# Patient Record
Sex: Male | Born: 1990 | Race: White | Hispanic: No | Marital: Married | Smoking: Current every day smoker
Health system: Southern US, Community
[De-identification: ages and names within clinical notes are randomized; demographics above are authoritative.]

---

## 2008-06-11 ENCOUNTER — Emergency Department: Payer: Self-pay | Admitting: Emergency Medicine

## 2008-10-22 ENCOUNTER — Emergency Department: Payer: Self-pay | Admitting: Emergency Medicine

## 2009-01-12 ENCOUNTER — Emergency Department: Payer: Self-pay | Admitting: Unknown Physician Specialty

## 2009-11-09 ENCOUNTER — Emergency Department: Payer: Self-pay | Admitting: Emergency Medicine

## 2011-12-20 ENCOUNTER — Emergency Department: Payer: Self-pay | Admitting: Emergency Medicine

## 2011-12-20 LAB — BASIC METABOLIC PANEL
Anion Gap: 9 (ref 7–16)
BUN: 10 mg/dL (ref 7–18)
Calcium, Total: 9.3 mg/dL (ref 8.5–10.1)
Chloride: 107 mmol/L (ref 98–107)
Co2: 28 mmol/L (ref 21–32)
EGFR (African American): >60
Osmolality: 286 (ref 275–301)
Potassium: 3.8 mmol/L (ref 3.5–5.1)

## 2011-12-20 LAB — CBC
HCT: 43.3 % (ref 40.0–52.0)
HGB: 15.2 g/dL (ref 13.0–18.0)
MCH: 33.9 pg (ref 26.0–34.0)
MCV: 97 fL (ref 80–100)
RBC: 4.48 10*6/uL (ref 4.40–5.90)
RDW: 12.7 % (ref 11.5–14.5)
WBC: 7.9 10*3/uL (ref 3.8–10.6)

## 2012-07-04 ENCOUNTER — Emergency Department: Payer: Self-pay

## 2012-07-04 LAB — URINALYSIS, COMPLETE
Bacteria: NONE SEEN
Bilirubin,UR: NEGATIVE
Glucose,UR: NEGATIVE mg/dL (ref 0–75)
Ketone: NEGATIVE
Nitrite: NEGATIVE
Ph: 5 (ref 4.5–8.0)
RBC,UR: 6 /HPF (ref 0–5)
Specific Gravity: 1.028 (ref 1.003–1.030)
WBC UR: 1 /HPF (ref 0–5)

## 2012-09-05 ENCOUNTER — Emergency Department: Payer: Self-pay | Admitting: Emergency Medicine

## 2012-09-05 LAB — URINALYSIS, COMPLETE
Glucose,UR: NEGATIVE mg/dL (ref 0–75)
Leukocyte Esterase: NEGATIVE
Nitrite: NEGATIVE
Protein: NEGATIVE
Specific Gravity: 1.016 (ref 1.003–1.030)

## 2012-09-05 LAB — CBC
HCT: 45.7 % (ref 40.0–52.0)
HGB: 16 g/dL (ref 13.0–18.0)
MCH: 33.4 pg (ref 26.0–34.0)
MCHC: 35 g/dL (ref 32.0–36.0)
Platelet: 205 10*3/uL (ref 150–440)
RDW: 13.5 % (ref 11.5–14.5)
WBC: 7.2 10*3/uL (ref 3.8–10.6)

## 2012-09-05 LAB — COMPREHENSIVE METABOLIC PANEL
Albumin: 4.5 g/dL (ref 3.4–5.0)
Alkaline Phosphatase: 103 U/L (ref 50–136)
Anion Gap: 2 — ABNORMAL LOW (ref 7–16)
Bilirubin,Total: 0.5 mg/dL (ref 0.2–1.0)
Co2: 32 mmol/L (ref 21–32)
EGFR (African American): >60
EGFR (Non-African Amer.): >60
Osmolality: 277 (ref 275–301)
Potassium: 5 mmol/L (ref 3.5–5.1)
SGOT(AST): 36 U/L (ref 15–37)
Sodium: 138 mmol/L (ref 136–145)
Total Protein: 7.6 g/dL (ref 6.4–8.2)

## 2012-10-12 ENCOUNTER — Emergency Department: Payer: Self-pay | Admitting: Emergency Medicine

## 2013-08-19 ENCOUNTER — Emergency Department: Payer: Self-pay | Admitting: Emergency Medicine

## 2016-03-29 ENCOUNTER — Encounter: Payer: Self-pay | Admitting: Emergency Medicine

## 2016-03-29 ENCOUNTER — Emergency Department
Admission: EM | Admit: 2016-03-29 | Discharge: 2016-03-29 | Disposition: A | Discharge status: Home / Self Care | Payer: Medicaid Other | Attending: Emergency Medicine | Admitting: Emergency Medicine

## 2016-03-29 DIAGNOSIS — B349 Viral infection, unspecified: Secondary | ICD-10-CM | POA: Insufficient documentation

## 2016-03-29 DIAGNOSIS — R197 Diarrhea, unspecified: Secondary | ICD-10-CM | POA: Diagnosis present

## 2016-03-29 DIAGNOSIS — F17210 Nicotine dependence, cigarettes, uncomplicated: Secondary | ICD-10-CM | POA: Insufficient documentation

## 2016-03-29 LAB — COMPREHENSIVE METABOLIC PANEL
ALBUMIN: 5 g/dL (ref 3.5–5.0)
ALT: 17 U/L (ref 17–63)
ANION GAP: 10 (ref 5–15)
AST: 22 U/L (ref 15–41)
Alkaline Phosphatase: 96 U/L (ref 38–126)
BUN: 17 mg/dL (ref 6–20)
CO2: 26 mmol/L (ref 22–32)
Calcium: 9.5 mg/dL (ref 8.9–10.3)
Chloride: 100 mmol/L — ABNORMAL LOW (ref 101–111)
Creatinine, Ser: 0.71 mg/dL (ref 0.61–1.24)
GFR calc Af Amer: >60 mL/min (ref >60)
GFR calc non Af Amer: >60 mL/min (ref >60)
GLUCOSE: 102 mg/dL — AB (ref 65–99)
POTASSIUM: 4.4 mmol/L (ref 3.5–5.1)
Sodium: 136 mmol/L (ref 135–145)
TOTAL PROTEIN: 8.2 g/dL — AB (ref 6.5–8.1)
Total Bilirubin: 0.6 mg/dL (ref 0.3–1.2)

## 2016-03-29 LAB — URINALYSIS, COMPLETE (UACMP) WITH MICROSCOPIC
BACTERIA UA: NONE SEEN
Bilirubin Urine: NEGATIVE
Glucose, UA: NEGATIVE mg/dL
HGB URINE DIPSTICK: NEGATIVE
KETONES UR: NEGATIVE mg/dL
Leukocytes, UA: NEGATIVE
NITRITE: NEGATIVE
PROTEIN: NEGATIVE mg/dL
Specific Gravity, Urine: 1.026 (ref 1.005–1.030)
Squamous Epithelial / LPF: NONE SEEN
pH: 5 (ref 5.0–8.0)

## 2016-03-29 LAB — CBC
HEMATOCRIT: 48.6 % (ref 40.0–52.0)
HEMOGLOBIN: 16 g/dL (ref 13.0–18.0)
MCH: 30.7 pg (ref 26.0–34.0)
MCHC: 33 g/dL (ref 32.0–36.0)
MCV: 92.9 fL (ref 80.0–100.0)
Platelets: 220 10*3/uL (ref 150–440)
RBC: 5.23 MIL/uL (ref 4.40–5.90)
RDW: 13.9 % (ref 11.5–14.5)
WBC: 11.3 10*3/uL — ABNORMAL HIGH (ref 3.8–10.6)

## 2016-03-29 LAB — LIPASE, BLOOD: LIPASE: 18 U/L (ref 11–51)

## 2016-03-29 MED ORDER — ONDANSETRON 4 MG PO TBDP: 4.0000 mg | ORAL_TABLET | Freq: Three times a day (TID) | ORAL | 0 refills | Status: AC | PRN

## 2016-03-29 NOTE — ED Provider Notes (Signed)
[facility] Emergency Department Provider Note  Time seen: 8:10 PM  I have reviewed the triage vital signs and the nursing notes.   HISTORY  Chief Complaint Nasal Congestion; Fever; and Abdominal Pain    HPI Marc Elliott is a 26 y.o. male with no past medical history who presents to the emergency department with 2 days of generalized weakness, fatigue, nausea and diarrhea. According to the patient for the past 48 hours he has been feeling very fatigued, he has been nauseated and vomited several times. Has had several episodes of diarrhea. States he has been feeling body aches but denies any fever or chills. States a slight cough and nasal congestion. Denies any sore throat.  History reviewed. No pertinent past medical history.  There are no active problems to display for this patient.   History reviewed. No pertinent surgical history.  Prior to Admission medications   Not on File    No Known Allergies  No family history on file.  Social History Social History  Substance Use Topics  . Smoking status: Current Every Day Smoker    Packs/day: 1.00    Types: Cigarettes  . Smokeless tobacco: Never Used  . Alcohol use No    Review of Systems Constitutional: Negative for fever.Positive for nasal congestion. Cardiovascular: Negative for chest pain. Respiratory: Negative for shortness of breath. Positive for cough. Gastrointestinal: States mild diffuse abdominal cramping. Positive for nausea and vomiting. Several episodes of diarrhea. Genitourinary: Negative for dysuria Neurological: Negative for headache 10-point ROS otherwise negative.  ____________________________________________   PHYSICAL EXAM:  VITAL SIGNS: ED Triage Vitals  Enc Vitals Group     BP 03/29/16 1740 (!) 107/54     Pulse Rate 03/29/16 1740 100     Resp 03/29/16 1740 18     Temp 03/29/16 1740 98.6 F (37 C)     Temp Source 03/29/16 1740 Oral     SpO2 03/29/16 1740 99 %      Weight 03/29/16 1732 163 lb (73.9 kg)     Height 03/29/16 1732 5\' 10"  (1.778 m)     Head Circumference --      Peak Flow --      Pain Score 03/29/16 1732 7     Pain Loc --      Pain Edu? --      Excl. in GC? --     Constitutional: Alert and oriented. Well appearing and in no distress. Eyes: Normal exam ENT   Head: Normocephalic and atraumatic   Mouth/Throat: Mucous membranes are moist. Cardiovascular: Normal rate, regular rhythm. No murmur Respiratory: Normal respiratory effort without tachypnea nor retractions. Breath sounds are clear  Gastrointestinal: Soft, slight diffuse abdominal tenderness, no rebound or guarding. No focal area of tenderness identified. Musculoskeletal: Nontender with normal range of motion in all extremities.  Neurologic:  Normal speech and language. No gross focal neurologic deficits  Skin:  Skin is warm, dry and intact.  Psychiatric: Mood and affect are normal.      INITIAL IMPRESSION / ASSESSMENT AND PLAN / ED COURSE  Pertinent labs & imaging results that were available during my care of the patient were reviewed by me and considered in my medical decision making (see chart for details).  Patient has been seen in the emergency department for generalized weakness, body aches, occasional cough, nausea with several episodes of vomiting and several episodes of diarrhea. No focal tenderness identified during exam, vitals are reassuring. Patient's symptoms are most suggestive of a viral  illness. Patient's labs are largely within normal limits besides a slight leukocytosis. I discussed supportive care at home, obtaining plain of rest, Tylenol and Motrin and we will prescribe Zofran as needed. Patient agreeable to plan.  ____________________________________________   FINAL CLINICAL IMPRESSION(S) / ED DIAGNOSES  Viral illness    Minna Antis, MD 03/29/16 2016

## 2016-03-29 NOTE — ED Notes (Signed)
Pt discharged to home.  Family member driving.  Discharge instructions reviewed.  Verbalized understanding.  No questions or concerns at this time.  Teach back verified.  Pt in NAD.  No items left in ED.   

## 2016-03-29 NOTE — ED Triage Notes (Signed)
Patient presents to the ED with nausea, vomiting, and diarrhea, fever, body aches, and abdominal pain.  Patient states symptoms began yesterday.  Patient reports a family member with some similar symptoms.

## 2016-04-10 ENCOUNTER — Emergency Department: Payer: Medicaid Other

## 2016-04-10 ENCOUNTER — Encounter: Payer: Self-pay | Admitting: Medical Oncology

## 2016-04-10 ENCOUNTER — Emergency Department
Admission: EM | Admit: 2016-04-10 | Discharge: 2016-04-10 | Disposition: A | Discharge status: Home / Self Care | Payer: Medicaid Other | Attending: Emergency Medicine | Admitting: Emergency Medicine

## 2016-04-10 DIAGNOSIS — Z79899 Other long term (current) drug therapy: Secondary | ICD-10-CM | POA: Insufficient documentation

## 2016-04-10 DIAGNOSIS — Y999 Unspecified external cause status: Secondary | ICD-10-CM | POA: Insufficient documentation

## 2016-04-10 DIAGNOSIS — F17210 Nicotine dependence, cigarettes, uncomplicated: Secondary | ICD-10-CM | POA: Diagnosis not present

## 2016-04-10 DIAGNOSIS — W208XXA Other cause of strike by thrown, projected or falling object, initial encounter: Secondary | ICD-10-CM | POA: Insufficient documentation

## 2016-04-10 DIAGNOSIS — Y929 Unspecified place or not applicable: Secondary | ICD-10-CM | POA: Insufficient documentation

## 2016-04-10 DIAGNOSIS — Y9389 Activity, other specified: Secondary | ICD-10-CM | POA: Insufficient documentation

## 2016-04-10 DIAGNOSIS — S6992XA Unspecified injury of left wrist, hand and finger(s), initial encounter: Secondary | ICD-10-CM | POA: Diagnosis present

## 2016-04-10 MED ORDER — TRAMADOL HCL 50 MG PO TABS: 50.0000 mg | ORAL_TABLET | Freq: Four times a day (QID) | ORAL | 0 refills | Status: AC | PRN

## 2016-04-10 MED ORDER — OXYCODONE-ACETAMINOPHEN 5-325 MG PO TABS
1.0000 | ORAL_TABLET | Freq: Once | ORAL | Status: AC
  Administered 2016-04-10: 1 via ORAL
  Filled 2016-04-10: qty 1

## 2016-04-10 NOTE — ED Triage Notes (Signed)
Pt reports that he fell a couple days ago and injured his left hand, c/o pain to fingers.

## 2016-04-10 NOTE — ED Notes (Signed)
Pt c/o L thumb pain at this time. Pt states jammed L thumb and then reinjured when he fell onto his L hand. Pt presents with swelling noted at this time. Pt also c/o decreased movement to thumb, index, and middle finger. Sensation intact, pulses intact, cap refill < 3 seconds.

## 2016-04-10 NOTE — ED Provider Notes (Signed)
[facility] Emergency Department Provider Note  ____________________________________________  Time seen: Approximately 2:50 PM  I have reviewed the triage vital signs and the nursing notes.   HISTORY  Chief Complaint Hand Pain    HPI Marc Elliott is a 26 y.o. male emergency department with left hand pain after jamming his thumb while helping a friend move some logs. Then he proceeded to fall on left hand. He states that it is painful to move thumb. Patient denies any additional injuries. No head trauma or loss of consciousness. Patient denies fever, shortness breath, cough, chest pain, nausea, vomiting, abdominal pain, diarrhea, constipation.   History reviewed. No pertinent past medical history.  There are no active problems to display for this patient.   History reviewed. No pertinent surgical history.  Prior to Admission medications   Medication Sig Start Date End Date Taking? Authorizing Provider  ondansetron (ZOFRAN ODT) 4 MG disintegrating tablet Take 1 tablet (4 mg total) by mouth every 8 (eight) hours as needed for nausea or vomiting. 03/29/16   Minna AntisKevin Paduchowski, MD  traMADol (ULTRAM) 50 MG tablet Take 1 tablet (50 mg total) by mouth every 6 (six) hours as needed. 04/10/16 04/10/17  Enid DerryAshley Semira Stoltzfus, PA-C    Allergies Patient has no known allergies.  No family history on file.  Social History Social History  Substance Use Topics  . Smoking status: Current Every Day Smoker    Packs/day: 1.00    Types: Cigarettes  . Smokeless tobacco: Never Used  . Alcohol use No     Review of Systems  Constitutional: No fever/chills ENT: No upper respiratory complaints. Cardiovascular: No chest pain. Respiratory: No cough. No SOB. Gastrointestinal: No abdominal pain.  No nausea, no vomiting.  Skin: Negative for rash, abrasions, lacerations. Neurological: Negative for headaches, numbness or  tingling   ____________________________________________   PHYSICAL EXAM:  VITAL SIGNS: ED Triage Vitals  Enc Vitals Group     BP 04/10/16 1259 130/78     Pulse Rate 04/10/16 1259 72     Resp 04/10/16 1259 18     Temp 04/10/16 1259 97.5 F (36.4 C)     Temp Source 04/10/16 1259 Oral     SpO2 04/10/16 1259 98 %     Weight 04/10/16 1300 163 lb (73.9 kg)     Height 04/10/16 1300 5\' 10"  (1.778 m)     Head Circumference --      Peak Flow --      Pain Score 04/10/16 1300 10     Pain Loc --      Pain Edu? --      Excl. in GC? --      Constitutional: Alert and oriented. Well appearing and in no acute distress. Eyes: Conjunctivae are normal. PERRL. EOMI. Head: Atraumatic. ENT:      Ears:      Nose: No congestion/rhinnorhea.      Mouth/Throat: Mucous membranes are moist.  Neck: No stridor.  Cardiovascular: Normal rate, regular rhythm.  Good peripheral circulation. 2+ radial pulses. Respiratory: Normal respiratory effort without tachypnea or retractions. Lungs CTAB. Good air entry to the bases with no decreased or absent breath sounds. Musculoskeletal: No gross deformities appreciated. Limited range of motion of left thumb. Mild swelling present at base of thumb. Neurologic:  Normal speech and language. No gross focal neurologic deficits are appreciated.  Skin:  Skin is warm, dry and intact. No rash noted. Psychiatric: Mood and affect are normal. Speech and behavior are normal. Patient exhibits  appropriate insight and judgement.   ____________________________________________   LABS (all labs ordered are listed, but only abnormal results are displayed)  Labs Reviewed - No data to display ____________________________________________  EKG   ____________________________________________  RADIOLOGY  Dg Hand 2 View Left  Result Date: 04/10/2016 CLINICAL DATA:  26 year old male with history of pain in the left thigh lump after falling onto his left hand. Swelling in the left  thumb. EXAM: LEFT HAND - 2 VIEW COMPARISON:  No priors. FINDINGS: There is no evidence of fracture or dislocation. There is no evidence of arthropathy or other focal bone abnormality. Soft tissues are unremarkable. IMPRESSION: Negative. Electronically Signed   By: Trudie Reed M.D.   On: 04/10/2016 15:28    ____________________________________________    PROCEDURES  Procedure(s) performed:    Procedures    Medications  oxyCODONE-acetaminophen (PERCOCET/ROXICET) 5-325 MG per tablet 1 tablet (1 tablet Oral Given 04/10/16 1504)     ____________________________________________   INITIAL IMPRESSION / ASSESSMENT AND PLAN / ED COURSE  Pertinent labs & imaging results that were available during my care of the patient were reviewed by me and considered in my medical decision making (see chart for details).  Review of the [place] CSRS was performed in accordance of the [facility] prior to dispensing any controlled drugs.     Patient's diagnosis is consistent with thumb injury. Vital signs and exam are reassuring. X-ray negative for any acute bony abnormalities. Education was provided. Patient was given a percocet in ED. Patient's wife is driving him home. Patient will be discharged home with prescriptions for a short course of tramadol. Patient is to follow up with PCP as directed. Patient is given ED precautions to return to the ED for any worsening or new symptoms.     ____________________________________________  FINAL CLINICAL IMPRESSION(S) / ED DIAGNOSES  Final diagnoses:  Injury of left thumb, initial encounter      NEW MEDICATIONS STARTED DURING THIS VISIT:  Discharge Medication List as of 04/10/2016  4:08 PM    START taking these medications   Details  traMADol (ULTRAM) 50 MG tablet Take 1 tablet (50 mg total) by mouth every 6 (six) hours as needed., Starting Sun 04/10/2016, Until Mon 04/10/2017, Print            This chart was dictated using voice recognition  software/Dragon. Despite best efforts to proofread, errors can occur which can change the meaning. Any change was purely unintentional.    Enid Derry, PA-C 04/10/16 1649    Nita Sickle, MD 04/11/16 1134

## 2018-07-09 IMAGING — DX DG HAND 2V*L*
2 series · 2 of 2 positions shown · non-contrast
Comparison: No priors.

CLINICAL DATA: 25-year-old male with history of pain in the left
thigh lump after falling onto his left hand. Swelling in the left
thumb.

EXAM:
LEFT HAND - 2 VIEW

[hand ap]
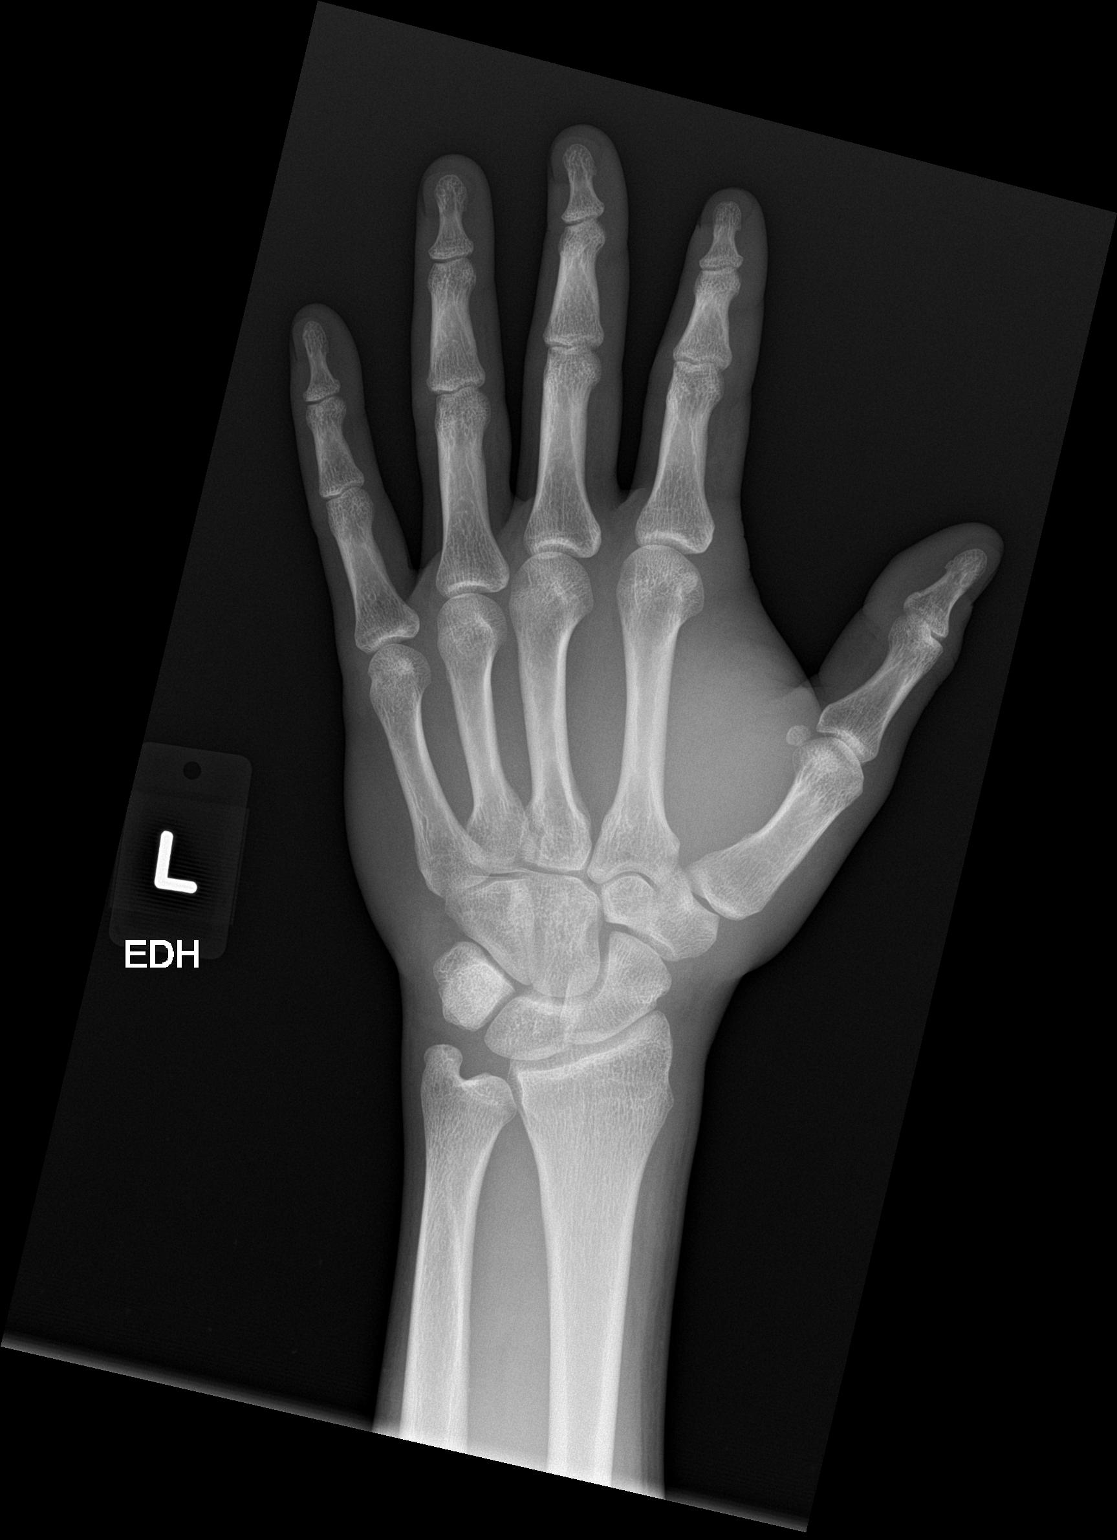

[hand lat]
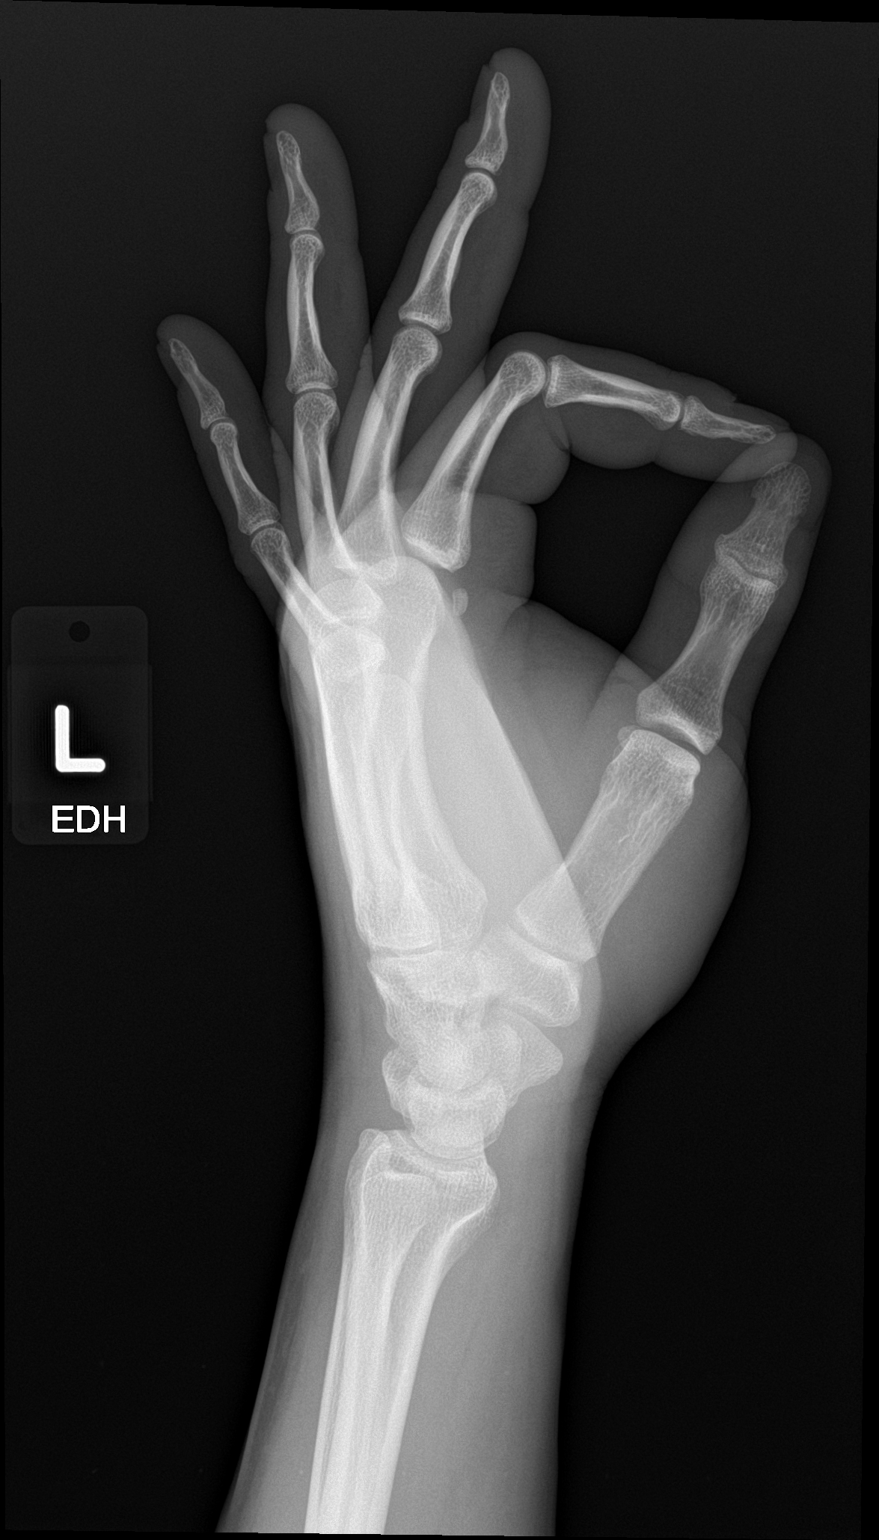

[2 of 2 positions shown; findings below may reference images not displayed]

FINDINGS: There is no evidence of fracture or dislocation. There is no
evidence of arthropathy or other focal bone abnormality. Soft
tissues are unremarkable.
IMPRESSION: Negative.
